# Patient Record
Sex: Female | Born: 1955 | Race: White | Hispanic: No | Marital: Married | Smoking: Former smoker
Health system: Southern US, Community
[De-identification: ages and names within clinical notes are randomized; demographics above are authoritative.]

## PROBLEM LIST (undated history)

## (undated) DIAGNOSIS — I10 Essential (primary) hypertension: Secondary | ICD-10-CM

## (undated) DIAGNOSIS — J449 Chronic obstructive pulmonary disease, unspecified: Secondary | ICD-10-CM

## (undated) DIAGNOSIS — I82409 Acute embolism and thrombosis of unspecified deep veins of unspecified lower extremity: Secondary | ICD-10-CM

## (undated) DIAGNOSIS — N200 Calculus of kidney: Secondary | ICD-10-CM

## (undated) DIAGNOSIS — R609 Edema, unspecified: Secondary | ICD-10-CM

## (undated) DIAGNOSIS — I2699 Other pulmonary embolism without acute cor pulmonale: Secondary | ICD-10-CM

## (undated) DIAGNOSIS — M109 Gout, unspecified: Secondary | ICD-10-CM

## (undated) DIAGNOSIS — E669 Obesity, unspecified: Secondary | ICD-10-CM

---

## 1999-03-10 ENCOUNTER — Encounter: Admission: RE | Admit: 1999-03-10 | Discharge: 1999-03-10 | Payer: Self-pay | Admitting: Urology

## 1999-03-10 ENCOUNTER — Encounter: Payer: Self-pay | Admitting: Urology

## 2006-04-08 ENCOUNTER — Emergency Department (HOSPITAL_COMMUNITY): Admission: EM | Admit: 2006-04-08 | Discharge: 2006-04-08 | Payer: Self-pay | Admitting: Emergency Medicine

## 2007-08-01 ENCOUNTER — Observation Stay (HOSPITAL_COMMUNITY): Admission: EM | Admit: 2007-08-01 | Discharge: 2007-08-02 | Payer: Self-pay | Admitting: Emergency Medicine

## 2007-08-04 ENCOUNTER — Inpatient Hospital Stay (HOSPITAL_COMMUNITY): Admission: EM | Admit: 2007-08-04 | Discharge: 2007-08-10 | Payer: Self-pay | Admitting: Emergency Medicine

## 2007-08-04 ENCOUNTER — Ambulatory Visit: Payer: Self-pay | Admitting: Emergency Medicine

## 2007-08-04 ENCOUNTER — Encounter (INDEPENDENT_AMBULATORY_CARE_PROVIDER_SITE_OTHER): Payer: Self-pay | Admitting: Internal Medicine

## 2007-08-06 ENCOUNTER — Ambulatory Visit: Payer: Self-pay | Admitting: Vascular Surgery

## 2007-08-06 ENCOUNTER — Encounter (INDEPENDENT_AMBULATORY_CARE_PROVIDER_SITE_OTHER): Payer: Self-pay | Admitting: Internal Medicine

## 2007-08-06 ENCOUNTER — Encounter (INDEPENDENT_AMBULATORY_CARE_PROVIDER_SITE_OTHER): Payer: Self-pay | Admitting: Emergency Medicine

## 2007-08-21 ENCOUNTER — Encounter: Payer: Self-pay | Admitting: Emergency Medicine

## 2008-09-30 ENCOUNTER — Emergency Department (HOSPITAL_COMMUNITY): Admission: EM | Admit: 2008-09-30 | Discharge: 2008-09-30 | Payer: Self-pay | Admitting: Emergency Medicine

## 2009-05-23 ENCOUNTER — Emergency Department (HOSPITAL_COMMUNITY): Admission: EM | Admit: 2009-05-23 | Discharge: 2009-05-23 | Payer: Self-pay | Admitting: Emergency Medicine

## 2010-05-09 ENCOUNTER — Encounter: Payer: Self-pay | Admitting: Internal Medicine

## 2010-05-24 ENCOUNTER — Ambulatory Visit (HOSPITAL_COMMUNITY)
Admission: RE | Admit: 2010-05-24 | Discharge: 2010-05-24 | Disposition: A | Discharge status: Home / Self Care | Payer: Self-pay | Source: Ambulatory Visit | Attending: Internal Medicine | Admitting: Internal Medicine

## 2010-05-24 ENCOUNTER — Other Ambulatory Visit (HOSPITAL_COMMUNITY): Payer: Self-pay | Admitting: Internal Medicine

## 2010-05-24 DIAGNOSIS — M25569 Pain in unspecified knee: Secondary | ICD-10-CM | POA: Insufficient documentation

## 2010-05-24 DIAGNOSIS — M25562 Pain in left knee: Secondary | ICD-10-CM

## 2010-05-24 DIAGNOSIS — M25552 Pain in left hip: Secondary | ICD-10-CM

## 2010-07-07 LAB — RAPID STREP SCREEN (MED CTR MEBANE ONLY): Streptococcus, Group A Screen (Direct): NEGATIVE

## 2010-07-26 LAB — POCT CARDIAC MARKERS
CKMB, poc: <1 ng/mL — ABNORMAL LOW (ref 1.0–8.0)
Myoglobin, poc: 67.1 ng/mL (ref 12–200)

## 2010-07-26 LAB — CBC
Hemoglobin: 14.2 g/dL (ref 12.0–15.0)
MCHC: 34 g/dL (ref 30.0–36.0)
RDW: 15.4 % (ref 11.5–15.5)

## 2010-07-26 LAB — BASIC METABOLIC PANEL
CO2: 29 mEq/L (ref 19–32)
Calcium: 9.9 mg/dL (ref 8.4–10.5)
Creatinine, Ser: 0.97 mg/dL (ref 0.4–1.2)
GFR calc Af Amer: >60 mL/min (ref >60)
GFR calc non Af Amer: 60 mL/min — ABNORMAL LOW (ref >60)
Glucose, Bld: 116 mg/dL — ABNORMAL HIGH (ref 70–99)
Sodium: 139 mEq/L (ref 135–145)

## 2010-07-26 LAB — D-DIMER, QUANTITATIVE: D-Dimer, Quant: 0.22 ug/mL-FEU (ref 0.00–0.48)

## 2010-07-26 LAB — DIFFERENTIAL
Basophils Absolute: 0 10*3/uL (ref 0.0–0.1)
Basophils Relative: 0 % (ref 0–1)
Lymphocytes Relative: 20 % (ref 12–46)
Monocytes Absolute: 0.2 10*3/uL (ref 0.1–1.0)
Neutro Abs: 5.2 10*3/uL (ref 1.7–7.7)
Neutrophils Relative %: 76 % (ref 43–77)

## 2010-08-31 NOTE — Discharge Summary (Signed)
Nancy Wheeler, ROZAR           ACCOUNT NO.:  [id number]   MEDICAL RECORD NO.:  [id number]          PATIENT TYPE:  INP   LOCATION:  2920                         FACILITY:  [facility]   PHYSICIAN:  Hind I Elsaid, MD      DATE OF BIRTH:  Sep 02, 1955   DATE OF ADMISSION:  08/04/2007  DATE OF DISCHARGE:                               DISCHARGE SUMMARY   PRIMARY CARE PHYSICIAN:  Nancy Rear. Sherwood Gambler, MD, [place], [place].   DISCHARGE DIAGNOSES:  1. Multiple bilateral pulmonary emboli.  2. Hypoxemic respiratory failure.  3. Altered mental status secondary to hypoxemic respiratory failure.  4. Morbid obesity/obesity hypoventilation syndrome, to rule out      obstructive sleep apnea.  5. High liver function tests, improving.  6. Microcytic anemia.  7. Acute renal failure, resolved.  8. History of staghorn calculi of the right kidney secondary to uric      acid stone.   Medications to be dictated at the date of discharge.   CONSULTATION:  Cardiology consulted, done by [facility].  Pulmonary consulted, done by Dr. Delton Coombes.   PROCEDURES:  1. Chest x-ray.  Cardiomegaly with vascular congestion, worsening      bibasilar atelectasis developing right lower lobe infiltrate,      __________.  2. CT angiogram.  Multiple bilateral pulmonary emboli with bibasilar      atelectasis.  3. 2D echo.  Left ventricular size were the upper limit of normal.      Overall, left ventricular systolic function was normal.  Left      ventricular ejection fraction was estimated to be 55% to 60%.  This      is for the inadequate to evaluate the left ventricular region wall      motion abnormalities.  Features were consistent with pseudo normal      left ventricular filling, better, has concomitant abnormal      relaxation and increased filling pressure.  The left atrium was      mildly dilated.   HISTORY OF PRESENT ILLNESS:  This is a 55 year old female, who was  recently discharged from [facility]  on above CT scan for renal colic  secondary to urolithiasis.  The patient woke up at 3 a.m. went to  recline and then sat down, her spouse noticed that there was some  respiratory difficulties and altered mental status.  EMS was called.  The patient's condition improved with oxygen.  The patient presented  with hypoxemic hypercapnic respiratory failure and found to have  elevated D-dimer and cardiac enzymes.  Also, the patient was found to  have acute renal failure compared to kidney function drawn on August 02, 2007.  Accordingly, the patient was started on IV fluid and Lovenox full  dose.  Difficulty was with the CT angiogram secondary to acute renal  failure, which was then after resolution of, had acute renal failure.  CT angiogram confirmed bilateral pulmonary emboli.  The patient  according to her lab, started on heparin and Coumadin.  The patient has  a lower extremity venous Doppler, which was negative for any deep vein  thrombosis.  The patient is still continued to require high amount of  oxygen mainly.  At this time, the patient was 60% nonrebreather mask  with BiPAP at night.  Pulmonary were asked to evaluate the patient.Most  probably the hypoxia secondary to obstructive sleep apnea/morbid obesity  and cannot exclude obesity hypoventilation syndrome.  Plan is to titer FiO2 for her to keep oxygen saturation more than 92%  and to continue with the BiPAP at that time.  The patient need to have  outpatient sleep studies started.  1. Altered mental status, completely resolved during hospitalization.  2. Elevated troponin, which is most probably secondary to pulmonary      embolism, cardiology followup with [place], the patient and there seem      to be elevated cardiac enzymes, secondary toPE.  3. Elevated LFTs, which is improving at this time, could be secondary      to result of hypOtension or ischemia secondary to the hypoxia.  At      this time, liver function test is improving.   Hepatitis profile was      negative.  4. Anemia, which looks microcytic normochromic anemia.  Her iron study      show evidence of iron deficiency anemia.  Hemoglobin is stable at      9.8.  Stool guaiac was negative for any evidence of GI bleeding.      We recommended to evaluate for EGD and colonoscopy for evaluation      of any evidence of GI bleeding after the respiratory status      improved.      Hind Bosie Helper, MD  Electronically Signed     HIE/MEDQ  D:  08/07/2007  T:  08/08/2007  Job:  [id number]

## 2010-08-31 NOTE — H&P (Signed)
NAME:  Nancy Wheeler, Nancy Wheeler           ACCOUNT NO.:  [id number]   MEDICAL RECORD NO.:  [id number]          PATIENT TYPE:  OBV   LOCATION:  A323                          FACILITY:  [facility]   PHYSICIAN:  Dorris Singh, DO    DATE OF BIRTH:  1956-03-22   DATE OF ADMISSION:  08/01/2007  DATE OF DISCHARGE:  [facility]                              HISTORY & PHYSICAL   CHIEF COMPLAINT:  Flank pain, nausea, vomiting.   PRIMARY CARE PHYSICIAN:  Dr. Sherwood Gambler.   HISTORY OF PRESENT ILLNESS:  The patient is a 55 year old woman who  presented to the [facility] Emergency Room complaining of flank pain.  She stated that it started today and the onset was sudden.  She also  stated that there was more discomfort on the right side than the left.  She also stated she was unable to urinate and she has a chronic history  of uric kidney stones for which she takes allopurinol and she sees a  urologist, and she was concerned that this point in time with this pain  and that she has had nephrolithiasis before, she was concerned that she  could have a blockage from one of her kidney stones passing through her  urologic system.  She came to the emergency room to be evaluated.  Her  last normal period was March 08, 2006.  She has a past medical  history significant for hypertension, kidney stones, obesity and uric  acid kidney stones.   SOCIAL HISTORY:  She currently is a nonsmoker, nondrinker and denies any  drug abuse.   ALLERGIES:  SHE DOES HAVE ALLERGIES TO PENICILLIN AND CODEINE.   CURRENT MEDICATIONS:  Include Norvasc 5 mg once a day, allopurinol 300  mg once a day, benazepril.  There is no dose given.  Zyrtec 10 mg once a  day, aspirin 81 mg and Mucinex.  No dose given.  The patient also states  she takes Demerol for pain at home.   REVIEW OF SYSTEMS:  CONSTITUTIONAL:  Negative for weight loss, appetite  change.  EYES:  Negative for blurred vision.  EARS, NOSE, MOUTH AND  THROAT:  All negative.  CARDIOVASCULAR:   Negative for chest pain or  dyspnea.  RESPIRATORY:  Negative for wheezing, cough or shortness of  breath.  GASTROINTESTINAL:  Positive for abdominal pain.  Negative for  diarrhea, constipation.  Positive for nausea and vomiting.  GU:  Positive for flank pain.  MUSCULOSKELETAL:  Positive for back pain.  SKIN:  Negative for any rash or pruritus.  NEURO:  Negative for any  weakness or syncope.  METABOLIC:  Negative for intolerances to cold or  hot.  HEMATOLOGIC:  Negative for any anemia.   PHYSICAL EXAM:  Her blood pressure is 156/88, pulse rate 99, respiration  rate 20, temperature 97.1.  She is satting 99% on room air.  GENERAL:  The patient is a morbidly obese, well-developed, well-  nourished woman who appears her stated age.  Head is normocephalic,  atraumatic.  Eyes are EOMI, PERRLA and conjunctive show no injection or  discharge.  Sclerae, there is no icterus.  Ears:  The tympanic membranes  were visualized bilaterally.  ____________ are moist.  Teeth are in good  repair.  There is no erythema or exudate.  NECK:  Supple, thick, but nontender, no lymphadenopathy.  HEART:  Regular rate and rhythm.  S1 and S2 present.  Heart sounds were  distant.  RESPIRATORY:  Clear to auscultation bilaterally.  Breath sounds are  distant.  No rales, wheezes or rhonchi.  CHEST:  Movements are symmetrical with respirations.  ABDOMEN:  Large, pendulous, soft, nontender, nondistended.  Bowel sounds  in all 4 quadrants.  There is some mild CVA discomfort.  EXTREMITIES:  Positive pulses.  Lower Extremities:  No edema or cyanosis  noted.  SKIN:  Good turgor, good texture.  NEURO:  Cranial nerves II-XII are grossly intact.  Good sensation and  good strength throughout all extremities.   LABS:  Include a white count of 11.9, hemoglobin of 10.8, hematocrit of  32.2, platelet count of 293.  Sodium 137, potassium 4.4, chloride 104,  CO2 27, glucose 141, BUN 16, creatinine 0.93 and her urine currently is   negative.  While the patient was in the emergency room she apparently  passed the stone per patient and at that point in time she was admitted  to the service of [facility].   ASSESSMENT/PLAN:  1. Intractable nausea and vomiting.  2. Abdominal pain.  3. History of nephrolithiasis.   PLAN:  Admit the patient to the service of [facility] and observation.  Will hydrate her with IV fluids.  Will also provide pain medication  based on the patient's allergies.  Will start her off with Dilaudid.  Will give anti-emetics that include Zofran and Phenergan.  Will place  her on her home medications.  Will ask the nurse to get doses of blood  pressure medications as well.  Will give her a fluid bolus.  Will also  strain her urine for any type of stone and will reassess her in the  morning.  The plan is that if she has improved, plan on discharging  within 24 hours.      Dorris Singh, DO  Electronically Signed     CB/MEDQ  D:  08/01/2007  T:  08/02/2007  Job:  [id number]   cc:   Madelin Rear. Sherwood Gambler, MD  Fax: [phone]

## 2010-08-31 NOTE — Consult Note (Signed)
NAME:  Nancy Wheeler, Nancy Wheeler NO.:  [id number]   MEDICAL RECORD NO.:  [id number]           PATIENT TYPE:   LOCATION:                                 FACILITY:   PHYSICIAN:  Jake Bathe, MD      DATE OF BIRTH:  07-11-1955   DATE OF CONSULTATION:  08/04/2007  DATE OF DISCHARGE:                                 CONSULTATION   REASON FOR CONSULTATION:  Nancy Wheeler is being seen at the request of  Dr. Eda Paschal, [facility] hospitalist for the evaluation of elevated  troponin, possible acute coronary syndrome.   HISTORY OF PRESENT ILLNESS:  Nancy Wheeler is a 55 year old female with,  morbid obesity, weighing approximately 400 pounds with a uric acid  stones and recent evaluation in [facility] Emergency Department for  kidney stone pain.  At that visit, she was given Dilaudid and Phenergan,  was sent home, and yesterday was taking added Phenergan and then was  found basically unresponsive by her husband.  She slept almost 17 hours  in duration.  EMS was called, sent to [facility] for further evaluation.  Once here she was found to be hypoxic requiring face mask O2 never  intubated.  Cardiac biomarkers were drawn with second set showing CK  783, MB 4.7, troponin 0.53 mildly elevated.  I was asked to comment on  elevated troponin.   Prior to this hospitalization other than her kidney stone pain she  complains of shortness of breath with walking.  She does attribute this  to her obesity.  She has no prior cardiac history.  No chest pain  currently and no chest pain prior.  No syncope.  No presyncope.   PAST MEDICAL HISTORY:  1. Morbid obesity.  2. Uric acid stones on allopurinol.  3. Hypertension on 5 mg of Norvasc and 40 mg of benazepril.   PAST SURGICAL HISTORY:  None.   ALLERGIES:  Intolerance now to DILAUDID, CODEINE and PENICILLIN.   SOCIAL HISTORY:  She quit tobacco 20 years ago.  No alcohol use.  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  Her son and daughter were here as well as her mother-in-  Social worker.  She is married.   FAMILY HISTORY:  Her mother had myocardial infarction at age 38.   REVIEW OF SYSTEMS:  No bleeding, no syncope, weakness noted.  Unless  specified above all other 12 review of systems negative.   PHYSICAL EXAMINATION:  VITAL SIGNS:  Blood pressure 93-107/44-48, pulse  72-90, respirations 22, satting 97% on 60% face mask. Upon arrival she  decided to 81% on room air.  GENERAL:  Alert and oriented x3 in no acute  distress, lying in bed here with her family.  EYES:  Well-perfused conjunctivae.  EOMI.  Scleral icterus.  NECK:  Thick.  No carotid bruits appreciated.  No JVD.  CARDIOVASCULAR:  Distant heart sounds.  No appreciable murmurs, rubs or  gallops.  LUNGS:  Clear to auscultation bilaterally.  Normal respiratory effort.  ABDOMEN:  Soft, nontender, normoactive bowel sounds.  Obese.  No bruits  heard.  EXTREMITIES:  1 to 2+ edema, chronic venous insufficiency changes,  bilateral lower extremities palpable distal pulses.  NEUROLOGIC:  Nonfocal.  No tremors, oriented during this exam.  SKIN:  Warm, dry and intact. No rashes noted.   DIAGNOSTIC DATA:  EKG shows normal sinus rhythm, low voltage,  nonspecific ST-T wave changes.  QTC was 482. Chest x-ray personally  reviewed shows cardiomegaly and right basilar atelectasis.   LABORATORY DATA:  Sodium 137, potassium 3.9, BUN 36, creatinine 2.1, AST  1037, ALT 861, blood gas 7.38/46/126, white count 12.9, hemoglobin 9.5,  hematocrit 29.4, platelets 242, total cholesterol 155, triglycerides  180, LDL 86, HDL 33, CK most recent 783, MB 4.7, troponin 0.53. Point of  care cardiac markers in the ED showed myoglobin greater than 500, CK-MB  3.2, troponin 0.35, and D-dimer elevated at 5.11.   ASSESSMENT AND PLAN:  A 55 year old female with morbid obesity, status  post hypoxic respiratory failure, possibly secondary to medications   sedation with chronic renal insufficiency and elevated troponin.  1. Elevated troponin - not likely acute coronary syndrome.  Note that      she has chronic renal insufficiency and that she may have had a      small strain secondary to hypoxemia.  CK is elevated but MB is      normal and ratio is reassuring.  CK may be elevated just due to      sedentary nature and muscle breakdown.  We would go ahead and      continue aggressive medical therapy with aspirin and      antihypertensives.  We will check echocardiogram.  2. Hypoxemia - concerning for possible pulmonary embolism which is      currently being ruled out with V/Q scan.  In the meantime, she is      being placed on heparin drip.  She does have risk factors of      obesity and sedentary state.  3. Obesity - counseled on weight loss.  4. Uric acid stones - allopurinol.  5. Hypertension - continue Norvasc and benazepril.  Will follow along      with you.      Jake Bathe, MD  Electronically Signed     MCS/MEDQ  D:  08/04/2007  T:  08/04/2007  Job:  [id number]

## 2010-08-31 NOTE — Discharge Summary (Signed)
NAME:  Nancy Wheeler, Nancy Wheeler           ACCOUNT NO.:  [id number]   MEDICAL RECORD NO.:  [id number]          PATIENT TYPE:  OBV   LOCATION:  A323                          FACILITY:  [facility]   PHYSICIAN:  Skeet Latch, DO    DATE OF BIRTH:  [birth year]   DATE OF ADMISSION:  08/01/2007  DATE OF DISCHARGE:  04/16/2009LH                               DISCHARGE SUMMARY   DISCHARGE DIAGNOSES:  1. Intractable nausea and vomiting, now resolved.  2. Abdominal discomfort.  3. History of nephrolithiasis.   BRIEF HOSPITAL COURSE:  Please see H&P done by Dr. Elige Radon on  08/01/2007.  This is a 55 year old Caucasian female who presented to  [facility] ER complaining of flank pain.  The patient complained it  started suddenly and was severe in nature.  The patient stated that  there was more discomfort on her right side than her left.  She had  problems with urination.  The patient has a chronic history of kidney  stones, for which she takes allopurinol and sees urology, and was  concerned that this was a kidney stone.  The patient is also concerned  that she had blockage from 1 of her stones upon being seen.  She came to  the ER.  She had a CT of her abdomen and pelvis performed, which showed:  1. Multiple right renal calculi, including staghorn calculus at the      right renal pelvis measuring 2.5 cm in size.  2. Right perinephritic edema.  Pelvis showed question 6 mm diameter      passed calculus in the urinary bladder versus a mural calcification      in the left bladder wall posteriorly.  3. A 2.4 cm diameter fat and calcium-containing mass left adnexa,      suspicious for left ovarian dermoid tumor.   The patient was seen.  She was admitted to the service of [facility].  She was placed on IV hydration, as well as IV pain medication.  The  patient was also placed on antiemetics, and placed on her home  medications.  The patient has improved overnight and, at this time, she  is stable to be  discharged to home.   VITAL SIGNS ON DISCHARGE:  Temperature 99.5, pulse 99, respirations 22,  blood pressure 153/83, she is satting 97% on room air.   LABS:  From 08/01/2007 showed a sodium 137, potassium 4.4, chloride 104,  CO2 27, glucose 141, BUN 16, creatinine 0.93, white count 11.9,  hemoglobin 10.8, hematocrit 32.2, platelet count 293.   MEDICATIONS ON DISCHARGE:  Include allopurinol 300 mg 1 tab daily, she  is to resume her benazepril at previous dose, Norvasc 5 mg daily,  aspirin 81 mg daily, Zyrtec 10 mg daily, Mucinex DM daily.  The patient  states she was on Demerol.  She will resume that at previous dose also.   CONDITION ON DISCHARGE:  Stable.   DISPOSITION:  The patient to be discharged to home.   DISCHARGE INSTRUCTIONS:  The patient is to maintain a low sodium heart-  healthy diet.  She is to increase her activities slowly.  She  is to  follow up with Dr. Sherwood Gambler in the next 5 to 7 days.  The patient is to  follow up with her urologist at her next scheduled appointment if she  has complaints of flank pain or concern about her kidney stones.      Skeet Latch, DO  Electronically Signed     SM/MEDQ  D:  08/02/2007  T:  08/02/2007  Job:  [id number]   cc:   Madelin Rear. Sherwood Gambler, MD  Fax: [phone]

## 2010-08-31 NOTE — H&P (Signed)
NAME:  Nancy Wheeler, Nancy Wheeler NO.:  [id number]   MEDICAL RECORD NO.:  [id number]          PATIENT TYPE:  INP   LOCATION:  1823                         FACILITY:  [facility]   PHYSICIAN:  Isidor Holts, M.D.  DATE OF BIRTH:  [birth year]   DATE OF ADMISSION:  08/04/2007  DATE OF DISCHARGE:                              HISTORY & PHYSICAL   PRIMARY MEDICAL DOCTOR:  Dr. Audie Pinto Carmichaels.   The patient is unassigned to [place].   CHIEF COMPLAINT:  Respiratory difficulties, somnolence, fall x2.   HISTORY OF PRESENT ILLNESS:  This is a 55 year old female.  History is  in the main, supplied by her husband, who was present in the emergency  department.  According to him, she was admitted to [facility]  on August 01, 2007 to August 02, 2007 for renal colic secondary to  urolithiasis.  Following discharge, she had dry heaves, ate a big meal,  and on arrival at home about 5:00 p.m., took 2 tablets of Phenergan  followed by 2 tablets of 10-mg Valium.  She subsequently went to bed at  about 12:00 a.m. August 03, 2007.  She woke up at 3:00 a.m., went to a  recliner and sat down.  Her spouse at that time felt that she had some  respiratory difficulties; however, after watching her for a while he  checked on her again at about 5:00 a.m. and at that time felt that her  lips were a little blue.  At about 8:00 a.m., she had become  incoherent, and she slept most of the day.  She became increasingly  lethargic and as a matter of fact fell twice on attempts to get up.  Her  husband called the EMS, who arrived and administered oxygen, and she  improved with this.  She denies chest pain, however.   PAST MEDICAL HISTORY:  1. Hypertension.  2. Urolithiasis with  stones.  3. Morbid obesity.   MEDICATIONS:  1. Allopurinol 300 mg p.o. daily.  2. Benazepril, dose unknown.  3. Norvasc 5 mg p.o. daily.  4. Aspirin 81 mg p.o. daily.  5. Zyrtec 10 mg p.o. daily.  6. Mucinex DM daily.  7.  Demerol p.r.n., dose unknown.   ALLERGIES:  CODEINE AND PENICILLIN.   REVIEW OF SYSTEMS:  As her HPI and chief complaint.  Denies abdominal  pain, vomiting or diarrhea.  Denies fever or chills.  Denies cough.   SOCIAL HISTORY:  The patient is married, nonsmoker, nondrinker, has no  history of drug abuse.   FAMILY HISTORY:  Noncontributory.   PHYSICAL EXAMINATION:  VITAL SIGNS:  Temperature 97.1, pulse 78 per  minute, regular.  Respiratory rate 24, BP 95/54 mmHg, pulse oximeter 81%  to 84% on room air.  Following administration of 10 liters of oxygen via  mask, 94%.  GENERAL:  The patient did not appear to be in obvious acute distress at  time of this evaluation, alert, communicative, talking complete  sentences.  Not short of breath at rest.  HEENT:  No clinical pallor, no jaundice, no conjunctival injection.  Throat is clear.  NECK:  Supple.  JVP not seen secondary to fat neck.  CHEST:  Clinically clear to auscultation.  No wheezes, no crackles.  HEART:  Heart sounds 1 and 2 heard, normal, regular, no murmurs.  ABDOMEN:  Morbidly obese, unable to palpate organs.  LOWER EXTREMITY:  No pitting edema, palpable peripheral pulses.  MUSCULOSKELETAL:  Nonfocal exam.  CENTRAL NERVOUS SYSTEM:  No focal neurologic deficits on course of  examination.   INVESTIGATIONS:  Hemoglobin 11.9, hematocrit 35, troponin-I at point of  care 0.25, D-dimer 5.11.  Electrolytes:  Sodium 135, potassium 4.3,  chloride 101, CO2 26, BUN 3, creatinine 2.4, glucose 121.  ABG:  pH  7.337, PCO2 50.8, PO2 108, bicarbonate 27.4.  Chest x-ray data:  August 04, 2007 shows cardiomegaly, no heart failure.  Right distal  atelectasis.  A 12-lead EKG dated August 04, 2007 shows sinus rhythm,  regular 89 per minute, normal axis pattern, right bundle branch block  pattern.   ASSESSMENT/PLAN:  1. Respiratory failure type 2 i.e. hypercapnic/hypoxic, unclear      etiology.  I suspect obesity hypoventilation syndrome with       medication side effect; however, in view of recent hospitalization      it is important to rule out pulmonary embolism.  Unfortunately, the      creatinine is elevated; therefore, we can not do chest CT      angiogram.  We will have to do ventilation perfusion lung scan,      which has already been ordered by ED MD and is pending.  Meanwhile,      we will start empiric therapeutic Lovenox and utilize p.r.n. BiPAP.   1. Acute renal failure.  Creatinine was 0.93 on August 02, 2007 and BUN      was 16.  We shall manage with intravenous fluids and follow renal      indices.   1. History of urolithiasis.  The patient is currently asymptomatic.   Further management will depend on clinical course.      Isidor Holts, M.D.  Electronically Signed     CO/MEDQ  D:  08/04/2007  T:  08/04/2007  Job:  [id number]   cc:   Madelin Rear. Sherwood Gambler, MD

## 2010-08-31 NOTE — Consult Note (Signed)
Nancy Wheeler, Nancy Wheeler NO.:  [id number]   MEDICAL RECORD NO.:  [id number]          PATIENT TYPE:  INP   LOCATION:  2305                         FACILITY:  [facility]   PHYSICIAN:  Leslye Peer, MD    DATE OF BIRTH:  1955-05-03   DATE OF CONSULTATION:  08/06/2007  DATE OF DISCHARGE:                                 CONSULTATION   REASON FOR CONSULTATION:  Hypoxia,   CONSULTING PHYSICIAN:  Dr. Cristal Deer OT.   HISTORY OF PRESENT ILLNESS:  This is a 55 year old, morbidly obese,  white female who was initially admitted to [facility] on August 04, 2007 following a recent hospital discharge on August 02, 2007 where  she was treated for renal colic secondary to lithiasis. Apparently she  had been given a prescription for narcotics and benzodiazepines for  symptomatic relief of back pain, and renal colic. She maintained that  she did not have significant dyspnea.  She had reported to have been  taking both Phenergan and Valium at home following hospital discharge,  had had significant nausea, following administration of Dilaudid at  [facility].  Later during the evening of August 03, 2007, to the  morning of August 04, 2007 she was noted by her husband to have labored  respiratory pattern and a blue discoloration of her lips and therefore  the emergency response was called.  She had become progressively  lethargic with altered mental status, and impaired gait resulting in  fall x2.  Per medical records, her mental status improved with  supplemental oxygen.  Her diagnostic evaluation was positive for  bilateral pulmonary emboli, as well as bilateral atelectasis.  Therapy  to date has included empiric antibiotics with Avelox, IV systemic Solu-  Medrol, and IV anticoagulation with heparin, as well as systemic  anticoagulation with Coumadin.  She has required as high as 60%  supplemental oxygen during hospitalization with recumbent oxygen  saturations as low  as 85%, and exertional oxygen saturations in the mid  80s in spite of 6 liters nasal cannula.  The pulmonary service was asked  to evaluate in regards to her hypoxemia, and furthermore regarding  questions of underlying obesity hypoventilation syndrome, obstructive  sleep apnea, and therapy of pulmonary emboli.   PAST MEDICAL HISTORY:  Morbid obesity, current weight recorded at 183.4  kg.  Urolithiasis with stones, hypertension, environmental allergies,  and gout.   SOCIAL HISTORY:  She is married.  She stopped smoking approximately 20  years ago.  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  At baseline,  she reports exertional dyspnea with minimal activity.  On a typical day,  she reports becoming short of breath and requiring rest after dressing  in the morning.   FAMILY HISTORY:  There is no blood history of thromboembolic disease in  the form of DVT, or pulmonary thromboembolism.  Her mother did have  cardiac disease, there is no history of cancer in the family.  Of note,  her husband had been treated for pulmonary emboli.  ALLERGIES:  PENICILLIN, CODEINE, also sensitivity to DILAUDID.   CURRENT MEDICATIONS:  1. Aspirin.  2. Mucinex.  3. Sliding scale NovoLog insulin.  4. Atrovent q.6 h.  5. Solu-Medrol 60 mg IV q. 6 hours.  6. Avelox 400 mg IV q.24 h.  7. Protonix 40 mg p.o. daily.  8. Coumadin.  9. Heparin drip.  10.Xopenex p.r.n.   HOME MEDICATIONS:  Allopurinol, benazepril, Norvasc, aspirin, Zyrtec,  Mucinex, Demerol p.r.n.   REVIEW OF SYMPTOMS:  She is back to baseline.  The patient feels  strongly that this is secondary to medications.  She denies chest pain,  she does report environmental farm until postnasal drip with allergies.  She denies heartburn, chest discomfort, chest pain, gastroesophageal  reflux symptoms, however, does report a cough which is primarily noted  during recumbent  position.  She denies hemoptysis, denies discoloration  of sputum.  Has had no fever, does have bilateral chronic lower  extremity edema, denies pain, has no erythema.  Currently feels her  breathing is at baseline.   PHYSICAL EXAM:  Temperature 98.6, heart rate 72, blood pressure 121/55,  respirations 22-24, saturations 95% on 60% non-rebreather, have seen  saturations in the upright position 92% on 6 liters nasal cannula,  saturations have dropped to 85% in recumbent position.  GENERAL:  No acute distress, saturations as noted.  HEENT:  The neck is large, the mucous membranes are moist, there is no  clear jugular venous distention, no adenopathy.  There is a faint upper  airway expiratory wheeze.  PULMONARY:  Decreased in the bases.  CARDIAC:  Regular rate and rhythm.  ABDOMEN:  Obese, with no organomegaly and positive bowel sounds.  GU:  There is a Foley catheter in place.  EXTREMITIES:  Demonstrate bilateral edema without erythema or pain.  NEUROLOGIC:  Intact.   LABORATORY DATA:  Sodium 140, potassium 4.4, chloride 104, CO2 27. BUN  20, creatinine 0.95, glucose 225, hemoglobin 9.8, hematocrit 29.7, white  blood cell count 10.1, platelet count 230. Urinary Legionella negative,  spot strep test negative.  Monospot negative.  TSH 1.38.  BNP 230, D-  dimer 5.11.   IMPRESSION AND PLAN:  Hypoxic respiratory failure secondary to morbid  obesity, probable obesity hypoventilation syndrome and probable  obstructive sleep apnea, exacerbated by bilateral pulmonary emboli with  bilateral atelectasis.  There is currently no evidence of upper airway  infection, no evidence of pneumonia, no evidence of chronic obstructive  pulmonary disease or asthmatic exacerbation.  She is close to baseline  from a symptom standpoint.  Would wonder to what extent pulmonary emboli  may be an acute on chronic phenomenon for this patient.  Also would be  concerned about secondary pulmonary artery hypertension.   However, many  things to treat at this point before further investigating this i.e.  treating probable obstructive sleep apnea, aggressive weight loss  program, supplemental oxygen, suspect she has required supplemental  oxygen for some time prior to this initial evaluation.  Therefore  recommendations are to titrate FIO2 for sats greater than 92%, we will  trial an auto set BiPAP device at hour of sleep to evaluate nighttime  saturations, as well as daytime somnolence, and tolerance to this  procedure, she will require an outpatient formal polysomnogram to rule  out obstructive sleep apnea.  Additionally agree with anticoagulation.  Now that she is on a heparin drip will have to wait on procoagulant  panel to help determine length of therapy but minimally will require 6  months of anticoagulation.  We will discontinue Solu-Medrol, as no  evidence of asthmatic exacerbation, or bronchospasm, will also  discontinue Avelox as there is no evidence of upper airway infection,  bronchitis, or pneumonia, and this will further complicate her  anticoagulation regimen during Coumadin and menstruation.  Additionally  we will ask for a nutritional consultation for weight loss and dietary  counseling, follow-up echocardiogram and lower extremity Dopplers.  She  will certainly need outpatient  pulmonary follow-up for titration of therapy in regards to obesity  hypoventilation syndrome, obstructive sleep apnea, and further  evaluation for secondary pulmonary artery hypertension.  Thank you very  much for the opportunity to participate in Ms. Person' care.      Zenia Resides, NP      Leslye Peer, MD  Electronically Signed    PB/MEDQ  D:  08/06/2007  T:  08/06/2007  Job:  [id number]   cc:   Isidor Holts, M.D.

## 2011-01-11 LAB — LIPID PANEL
LDL Cholesterol: 86
Total CHOL/HDL Ratio: 4.7
Triglycerides: 180 — ABNORMAL HIGH
VLDL: 36

## 2011-01-11 LAB — LUPUS ANTICOAGULANT PANEL
DRVVT: 45.3 (ref 36.1–47.0)
Lupus Anticoagulant: NOT DETECTED
PTTLA 4:1 Mix: 56.2 — ABNORMAL HIGH (ref 36.3–48.8)
PTTLA Confirmation: 1.6 (ref <8.0)

## 2011-01-11 LAB — CBC
HCT: 28.2 — ABNORMAL LOW
HCT: 28.8 — ABNORMAL LOW
HCT: 29.4 — ABNORMAL LOW
HCT: 30.8 — ABNORMAL LOW
Hemoglobin: 9.2 — ABNORMAL LOW
Hemoglobin: 9.5 — ABNORMAL LOW
Hemoglobin: 9.5 — ABNORMAL LOW
Hemoglobin: 9.9 — ABNORMAL LOW
MCHC: 33.7
MCHC: 32.1
MCHC: 32.2
MCHC: 32.4
MCHC: 32.5
MCHC: 33.1
MCHC: 33.2
MCV: 75.5 — ABNORMAL LOW
MCV: 77.1 — ABNORMAL LOW
MCV: 77.4 — ABNORMAL LOW
Platelets: 293
Platelets: 209
Platelets: 212
Platelets: 230
Platelets: 236
RBC: 4.26
RBC: 3.69 — ABNORMAL LOW
RBC: 3.73 — ABNORMAL LOW
RBC: 3.73 — ABNORMAL LOW
RBC: 3.81 — ABNORMAL LOW
RBC: 3.81 — ABNORMAL LOW
RBC: 3.99
RDW: 19 — ABNORMAL HIGH
WBC: 10
WBC: 8.9
WBC: 9.4

## 2011-01-11 LAB — COMPREHENSIVE METABOLIC PANEL
ALT: 172 — ABNORMAL HIGH
ALT: 329 — ABNORMAL HIGH
ALT: 475 — ABNORMAL HIGH
AST: 1037 — ABNORMAL HIGH
AST: 231 — ABNORMAL HIGH
AST: 34
AST: 88 — ABNORMAL HIGH
Albumin: 2.8 — ABNORMAL LOW
Albumin: 2.9 — ABNORMAL LOW
Albumin: 3 — ABNORMAL LOW
Albumin: 3.1 — ABNORMAL LOW
Alkaline Phosphatase: 115
Alkaline Phosphatase: 118 — ABNORMAL HIGH
BUN: 20
BUN: 31 — ABNORMAL HIGH
CO2: 25
CO2: 28
CO2: 29
Calcium: 8.1 — ABNORMAL LOW
Calcium: 8.3 — ABNORMAL LOW
Calcium: 8.6
Calcium: 8.6
Chloride: 101
Chloride: 105
Chloride: 105
Chloride: 106
Creatinine, Ser: 0.95
Creatinine, Ser: 1.3
Creatinine, Ser: 2.17 — ABNORMAL HIGH
GFR calc Af Amer: 29 — ABNORMAL LOW
GFR calc Af Amer: >60
GFR calc Af Amer: >60
GFR calc Af Amer: >60
GFR calc non Af Amer: 55 — ABNORMAL LOW
GFR calc non Af Amer: >60
Glucose, Bld: 108 — ABNORMAL HIGH
Glucose, Bld: 91
Potassium: 3.6
Sodium: 136
Sodium: 137
Sodium: 141
Sodium: 142
Total Bilirubin: 0.2 — ABNORMAL LOW
Total Bilirubin: 0.3
Total Bilirubin: 0.4
Total Protein: 6.9

## 2011-01-11 LAB — URINALYSIS, ROUTINE W REFLEX MICROSCOPIC
Bilirubin Urine: NEGATIVE
Glucose, UA: NEGATIVE
Hgb urine dipstick: NEGATIVE
Ketones, ur: NEGATIVE
Protein, ur: NEGATIVE
Urobilinogen, UA: 0.2

## 2011-01-11 LAB — POCT I-STAT 3, ART BLOOD GAS (G3+)
Acid-Base Excess: 1
Acid-Base Excess: 2
Acid-base deficit: 1
O2 Saturation: 98
O2 Saturation: 99
Operator id: 251151
Operator id: 270211
Patient temperature: 97.7
pCO2 arterial: 45.3 — ABNORMAL HIGH
pO2, Arterial: 108 — ABNORMAL HIGH
pO2, Arterial: 85

## 2011-01-11 LAB — PROTIME-INR
INR: 0.9
INR: 2 — ABNORMAL HIGH
Prothrombin Time: 12.8
Prothrombin Time: 22.4 — ABNORMAL HIGH
Prothrombin Time: 26 — ABNORMAL HIGH
Prothrombin Time: 27.4 — ABNORMAL HIGH

## 2011-01-11 LAB — DIFFERENTIAL
Basophils Absolute: 0
Basophils Relative: 0
Eosinophils Absolute: 0
Neutro Abs: 9.9 — ABNORMAL HIGH
Neutrophils Relative %: 84 — ABNORMAL HIGH

## 2011-01-11 LAB — BASIC METABOLIC PANEL
BUN: 16
CO2: 27
Calcium: 9
Chloride: 104
Creatinine, Ser: 0.93
GFR calc Af Amer: >60
Glucose, Bld: 141 — ABNORMAL HIGH

## 2011-01-11 LAB — CULTURE, RESPIRATORY W GRAM STAIN: Culture: NORMAL

## 2011-01-11 LAB — IRON AND TIBC
Iron: 13 — ABNORMAL LOW
Saturation Ratios: 4 — ABNORMAL LOW
TIBC: 353
UIBC: 340

## 2011-01-11 LAB — POCT I-STAT, CHEM 8
Calcium, Ion: 1.11 — ABNORMAL LOW
Glucose, Bld: 121 — ABNORMAL HIGH
HCT: 35 — ABNORMAL LOW
Hemoglobin: 11.9 — ABNORMAL LOW
TCO2: 26

## 2011-01-11 LAB — B-NATRIURETIC PEPTIDE (CONVERTED LAB): Pro B Natriuretic peptide (BNP): 57

## 2011-01-11 LAB — HEPARIN LEVEL (UNFRACTIONATED)
Heparin Unfractionated: 0.16 — ABNORMAL LOW
Heparin Unfractionated: 0.26 — ABNORMAL LOW
Heparin Unfractionated: 0.91 — ABNORMAL HIGH

## 2011-01-11 LAB — CARDIAC PANEL(CRET KIN+CKTOT+MB+TROPI)
Relative Index: 0.6
Total CK: 783 — ABNORMAL HIGH
Troponin I: 0.53

## 2011-01-11 LAB — RETICULOCYTES: Retic Ct Pct: 2.2

## 2011-01-11 LAB — LEGIONELLA ANTIGEN, URINE

## 2011-01-11 LAB — RAPID STREP SCREEN (MED CTR MEBANE ONLY): Streptococcus, Group A Screen (Direct): NEGATIVE

## 2011-01-11 LAB — D-DIMER, QUANTITATIVE: D-Dimer, Quant: 5.11 — ABNORMAL HIGH

## 2011-01-11 LAB — VITAMIN B12: Vitamin B-12: 985 — ABNORMAL HIGH (ref 211–911)

## 2011-01-11 LAB — MONONUCLEOSIS SCREEN: Mono Screen: NEGATIVE

## 2011-01-11 LAB — HOMOCYSTEINE: Homocysteine: 9.9

## 2011-01-11 LAB — OCCULT BLOOD X 1 CARD TO LAB, STOOL: Fecal Occult Bld: NEGATIVE

## 2011-01-11 LAB — POCT CARDIAC MARKERS: CKMB, poc: 3.2

## 2011-09-14 IMAGING — CR DG HIP (WITH OR WITHOUT PELVIS) 2-3V*L*
3 series · 3 of 3 positions shown · non-contrast
Comparison: Left knee radiographs 05/24/2010

CLINICAL DATA: Left leg pain and left knee pain for 1 week.  No
known injury.

LEFT HIP - COMPLETE 2+ VIEW

[view not recorded (1 of 3)]
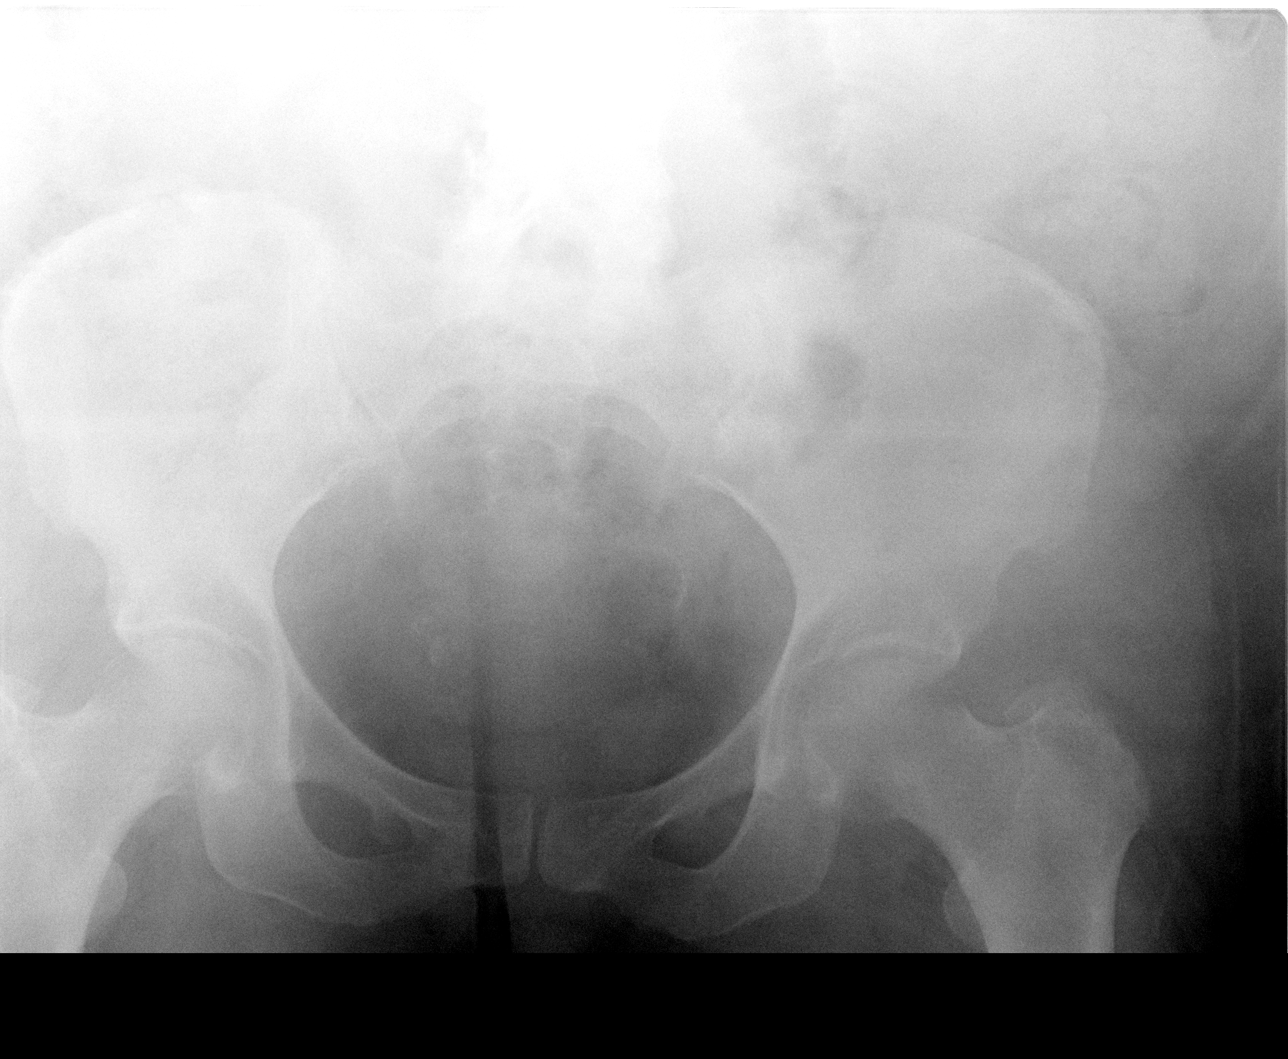

[view not recorded (2 of 3)]
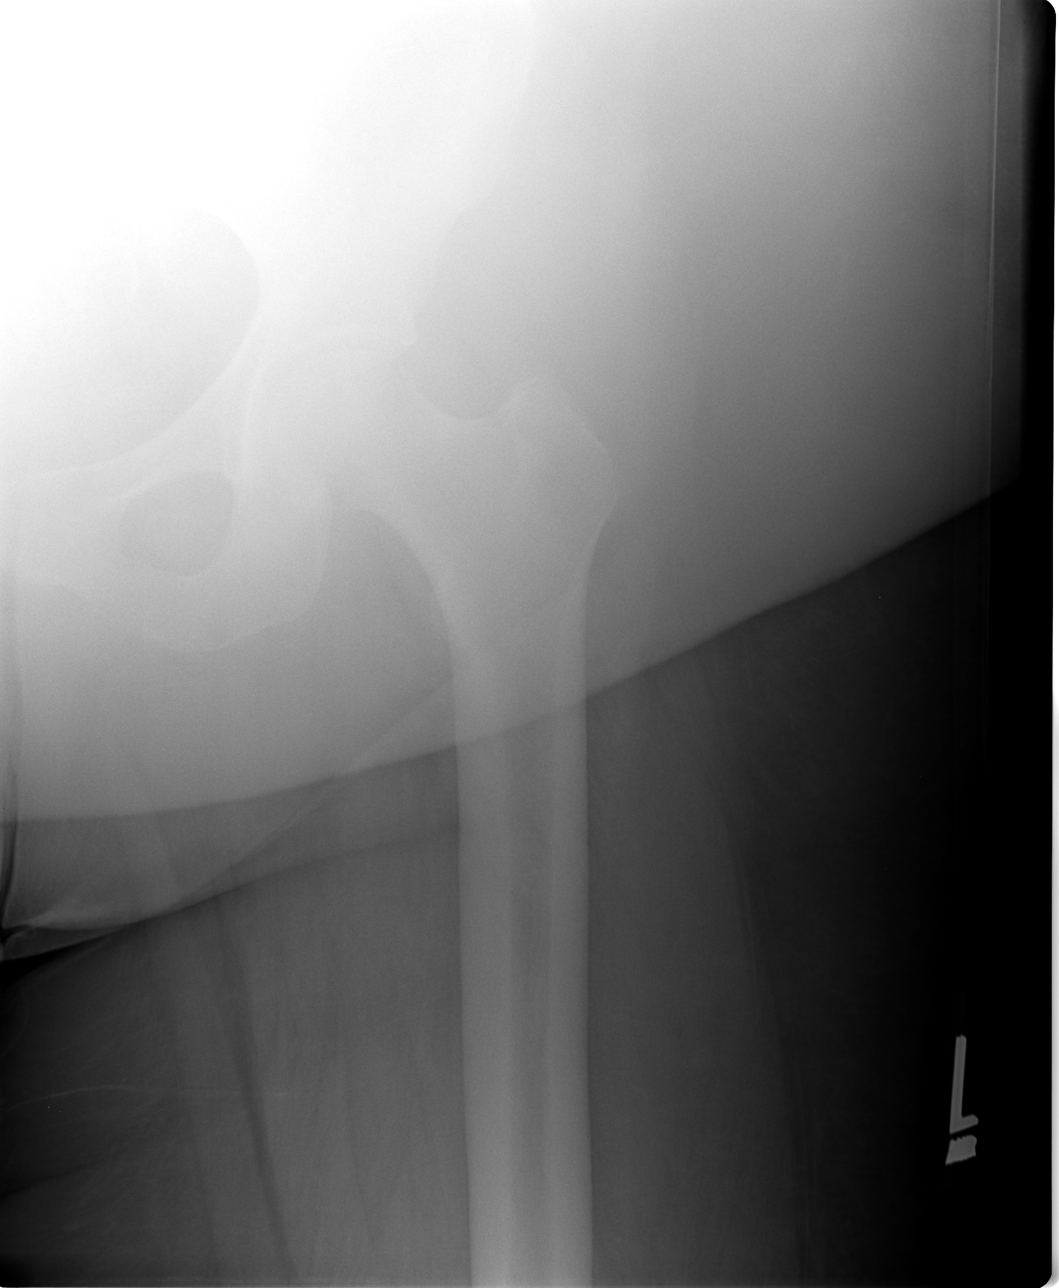

[view not recorded (3 of 3)]
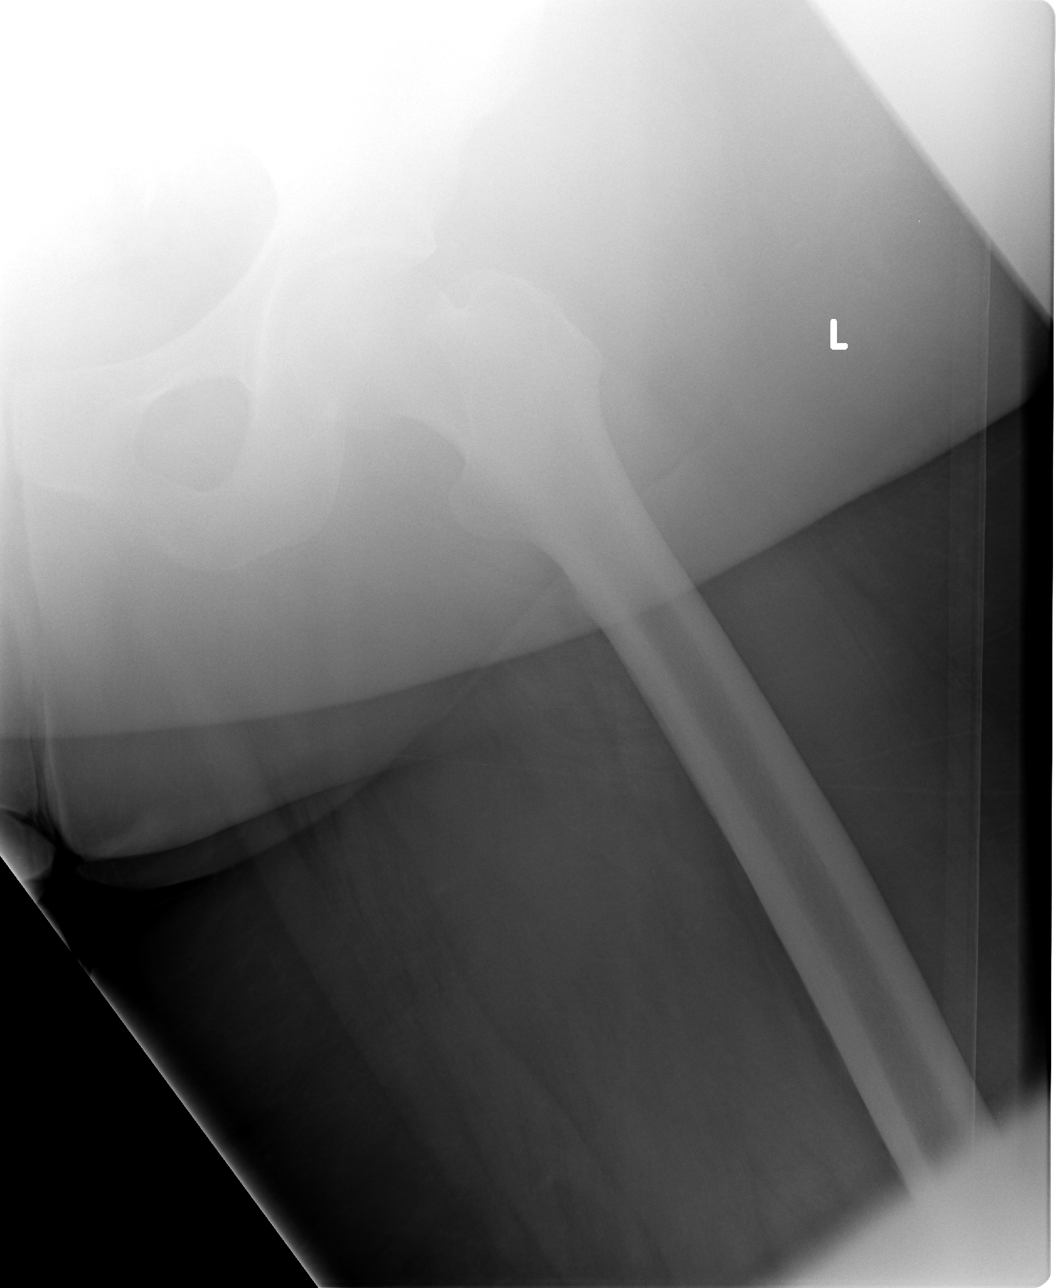

[3 of 3 positions shown; findings below may reference images not displayed]

FINDINGS: Obesity limits bony detail.  Left femoral head is located
within the left acetabulum.  Bony mineralization appears normal.
Joint space of the hip appears maintained.  No significant
degenerative change for patient age.  No acute fracture of the bony
pelvis or proximal left femur is identified.  Suggestion of
degenerative change of the lower lumbar spine.
IMPRESSION: Decreased bony detail due to body habitus.

No acute bony abnormality and no significant degenerative change of
the left hip.

## 2011-09-14 IMAGING — CR DG KNEE 1-2V*L*
2 series · 2 of 2 positions shown · non-contrast
Comparison: Left hip radiographs 05/24/2002

CLINICAL DATA: Left knee pain for 1 week.  No known injury.

LEFT KNEE - 1-2 VIEW

[view not recorded (1 of 2)]
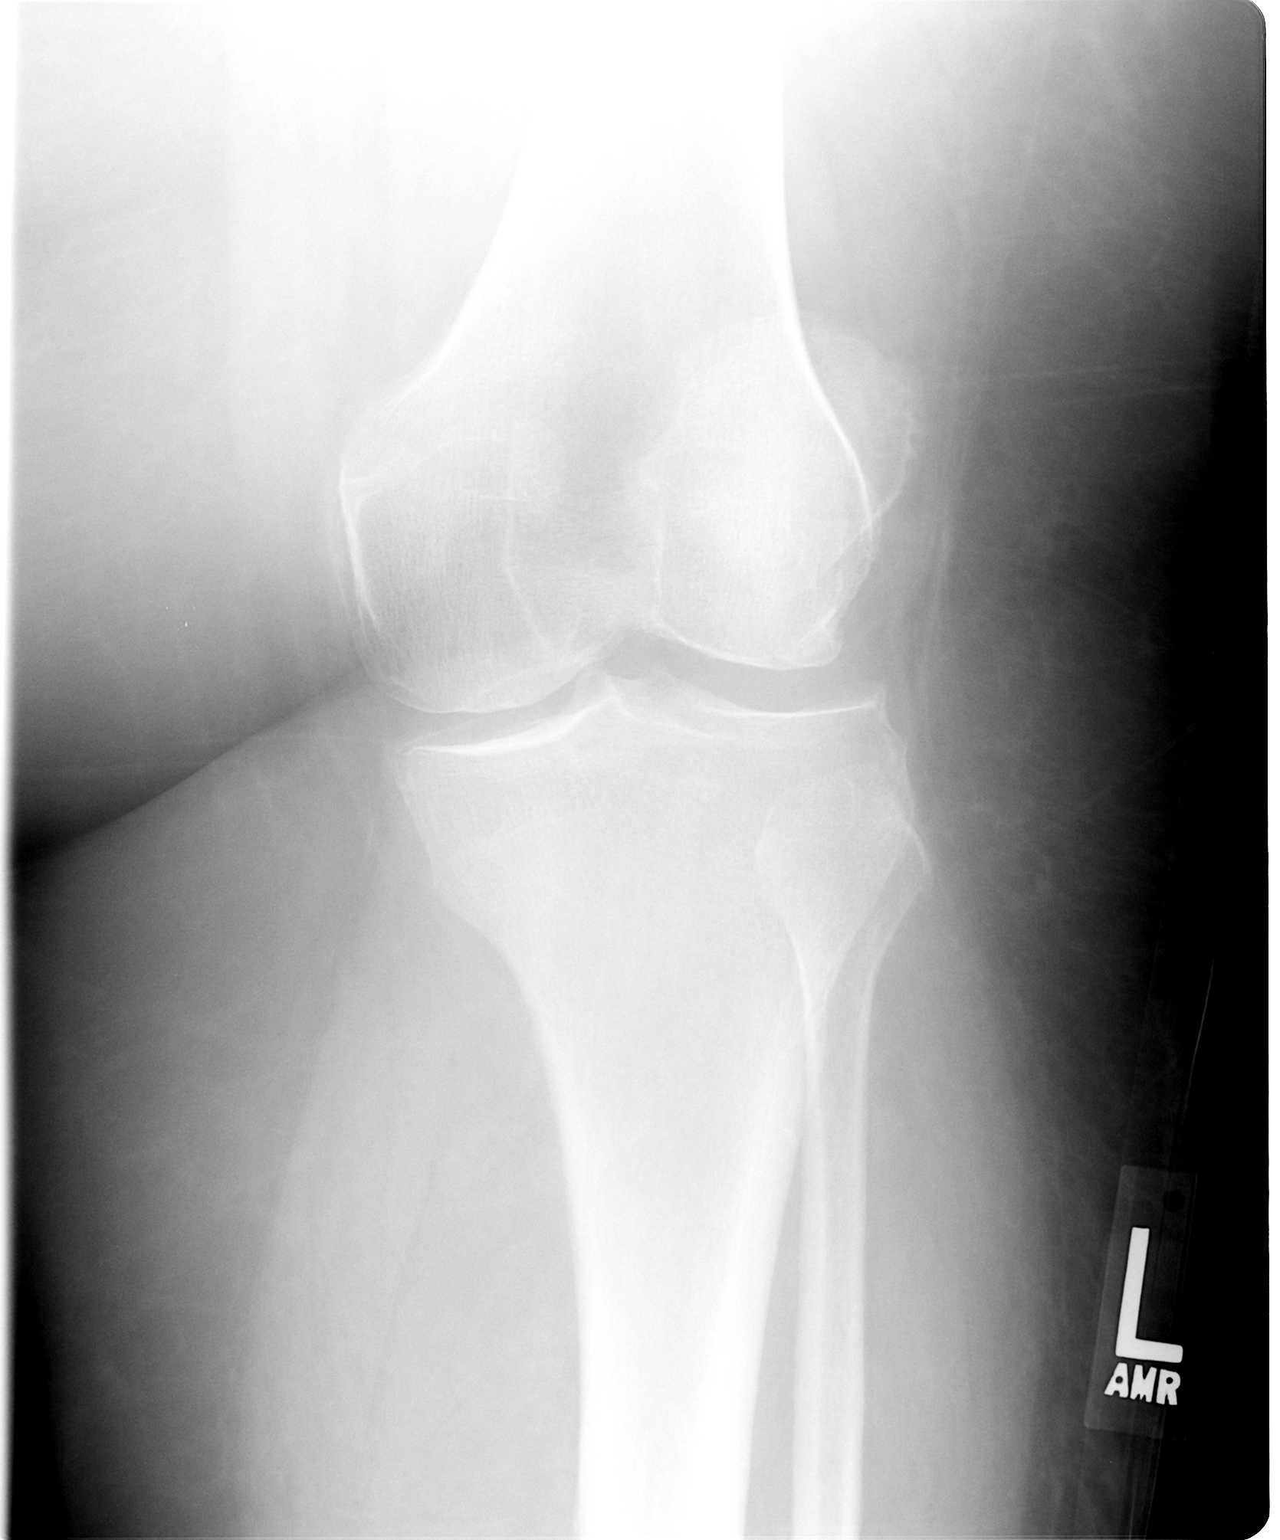

[view not recorded (2 of 2)]
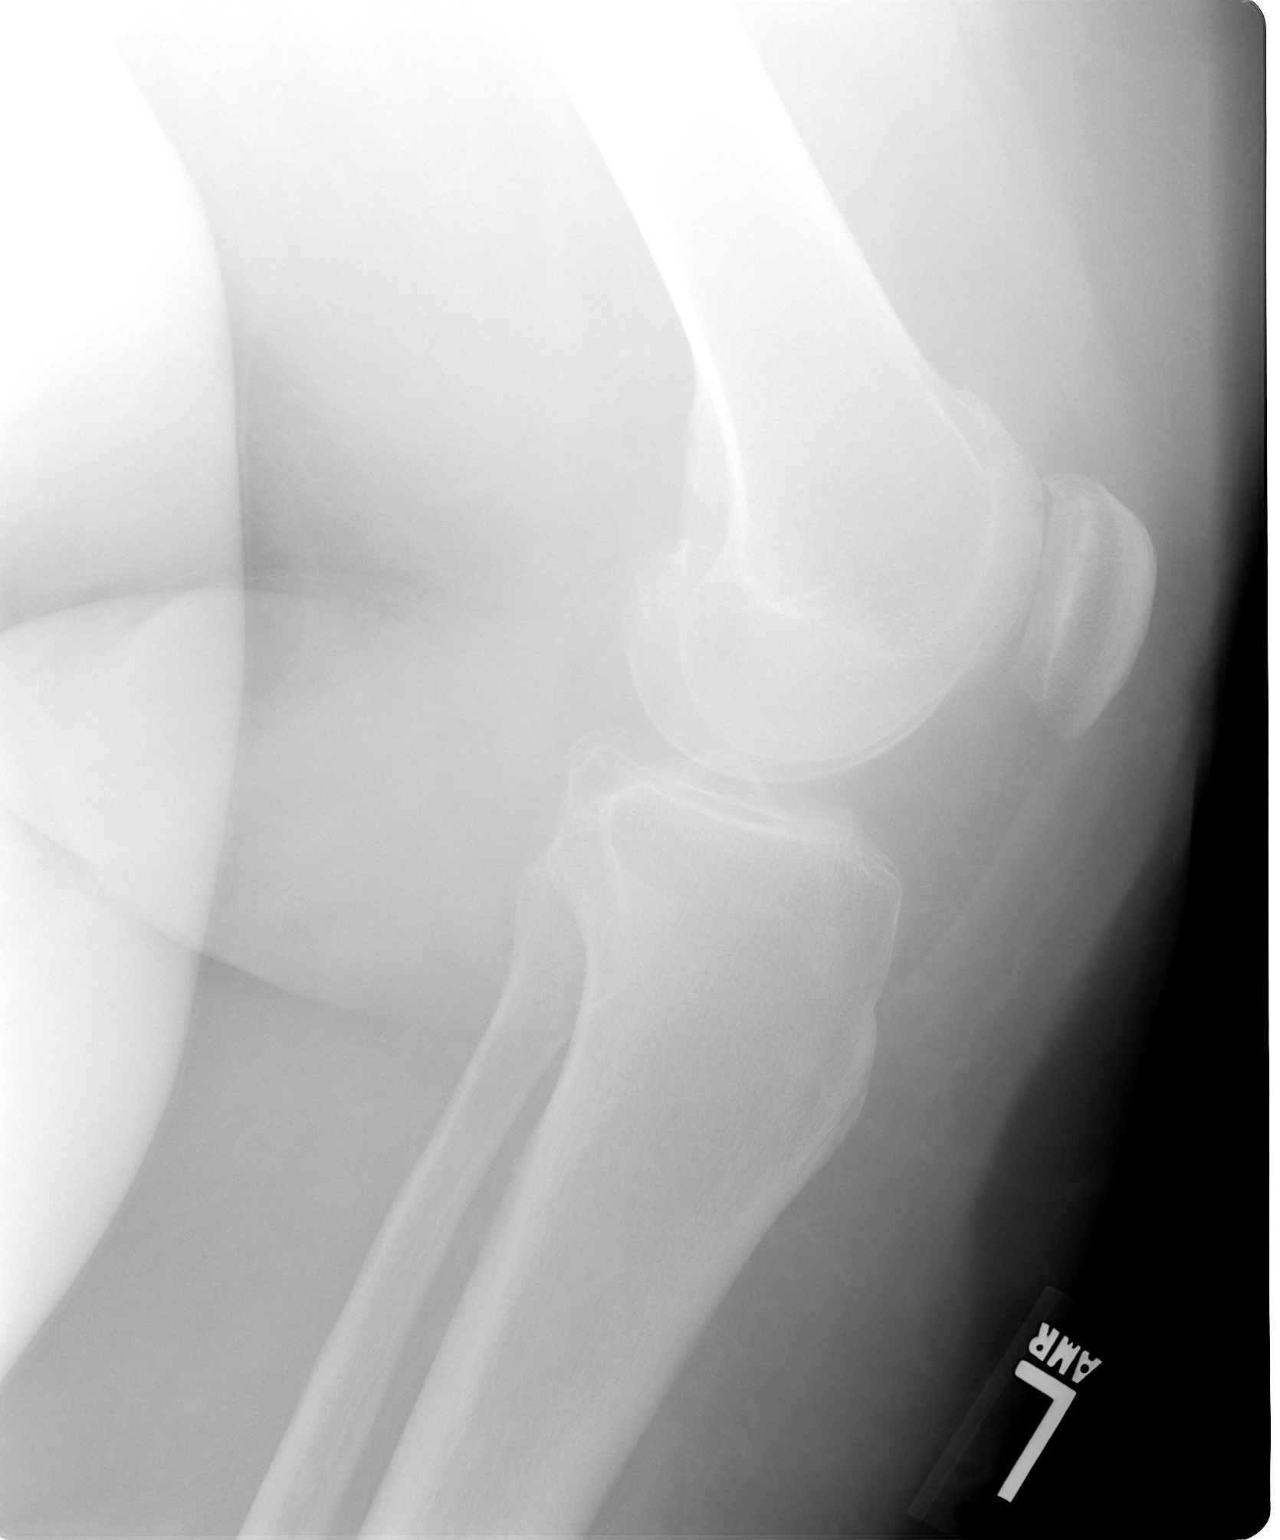

[2 of 2 positions shown; findings below may reference images not displayed]

FINDINGS: Obesity slightly limits bony detail Normal alignment and
mineralization.  Small osteophyte arises from the posterior-
superior patella.  Small osteophyte  is associated with the lateral
tibial plateau.  No fracture or visible joint effusion.  No
suspicious bony abnormality.
IMPRESSION: 1.  Mild degenerative changes of the patellofemoral and lateral
compartments.
2.  No acute bony abnormality or joint effusion.

## 2014-04-27 ENCOUNTER — Encounter (HOSPITAL_COMMUNITY): Payer: Self-pay | Admitting: Emergency Medicine

## 2014-04-27 ENCOUNTER — Emergency Department (HOSPITAL_COMMUNITY)
Admission: EM | Admit: 2014-04-27 | Discharge: 2014-04-27 | Disposition: A | Discharge status: Home / Self Care | Payer: Self-pay | Attending: Emergency Medicine | Admitting: Emergency Medicine

## 2014-04-27 DIAGNOSIS — Z87442 Personal history of urinary calculi: Secondary | ICD-10-CM | POA: Insufficient documentation

## 2014-04-27 DIAGNOSIS — Z86711 Personal history of pulmonary embolism: Secondary | ICD-10-CM | POA: Insufficient documentation

## 2014-04-27 DIAGNOSIS — Z87891 Personal history of nicotine dependence: Secondary | ICD-10-CM | POA: Insufficient documentation

## 2014-04-27 DIAGNOSIS — Z86718 Personal history of other venous thrombosis and embolism: Secondary | ICD-10-CM | POA: Insufficient documentation

## 2014-04-27 DIAGNOSIS — J449 Chronic obstructive pulmonary disease, unspecified: Secondary | ICD-10-CM | POA: Insufficient documentation

## 2014-04-27 DIAGNOSIS — Z8739 Personal history of other diseases of the musculoskeletal system and connective tissue: Secondary | ICD-10-CM | POA: Insufficient documentation

## 2014-04-27 DIAGNOSIS — Z880 Allergy status to penicillin: Secondary | ICD-10-CM | POA: Insufficient documentation

## 2014-04-27 DIAGNOSIS — I10 Essential (primary) hypertension: Secondary | ICD-10-CM | POA: Insufficient documentation

## 2014-04-27 DIAGNOSIS — K529 Noninfective gastroenteritis and colitis, unspecified: Secondary | ICD-10-CM | POA: Insufficient documentation

## 2014-04-27 HISTORY — DX: Calculus of kidney: N20.0

## 2014-04-27 HISTORY — DX: Edema, unspecified: R60.9

## 2014-04-27 HISTORY — DX: Other pulmonary embolism without acute cor pulmonale: I26.99

## 2014-04-27 HISTORY — DX: Essential (primary) hypertension: I10

## 2014-04-27 HISTORY — DX: Gout, unspecified: M10.9

## 2014-04-27 HISTORY — DX: Acute embolism and thrombosis of unspecified deep veins of unspecified lower extremity: I82.409

## 2014-04-27 HISTORY — DX: Obesity, unspecified: E66.9

## 2014-04-27 HISTORY — DX: Chronic obstructive pulmonary disease, unspecified: J44.9

## 2014-04-27 LAB — CBC WITH DIFFERENTIAL/PLATELET
BASOS ABS: 0 10*3/uL (ref 0.0–0.1)
BASOS PCT: 0 % (ref 0–1)
EOS ABS: 0.1 10*3/uL (ref 0.0–0.7)
EOS PCT: 1 % (ref 0–5)
HCT: 34.6 % — ABNORMAL LOW (ref 36.0–46.0)
HEMOGLOBIN: 8.9 g/dL — AB (ref 12.0–15.0)
LYMPHS PCT: 12 % (ref 12–46)
Lymphs Abs: 1.3 10*3/uL (ref 0.7–4.0)
MCH: 18 pg — ABNORMAL LOW (ref 26.0–34.0)
MCHC: 25.7 g/dL — ABNORMAL LOW (ref 30.0–36.0)
MCV: 69.9 fL — AB (ref 78.0–100.0)
MONO ABS: 0.7 10*3/uL (ref 0.1–1.0)
Monocytes Relative: 6 % (ref 3–12)
Neutro Abs: 9.4 10*3/uL — ABNORMAL HIGH (ref 1.7–7.7)
Neutrophils Relative %: 82 % — ABNORMAL HIGH (ref 43–77)
Platelets: 382 10*3/uL (ref 150–400)
RBC: 4.95 MIL/uL (ref 3.87–5.11)
RDW: 20.2 % — ABNORMAL HIGH (ref 11.5–15.5)
WBC: 11.5 10*3/uL — ABNORMAL HIGH (ref 4.0–10.5)

## 2014-04-27 LAB — COMPREHENSIVE METABOLIC PANEL
ALBUMIN: 3.8 g/dL (ref 3.5–5.2)
ALK PHOS: 132 U/L — AB (ref 39–117)
ALT: 106 U/L — AB (ref 0–35)
AST: 32 U/L (ref 0–37)
Anion gap: 6 (ref 5–15)
BUN: 15 mg/dL (ref 6–23)
CALCIUM: 8.5 mg/dL (ref 8.4–10.5)
CHLORIDE: 93 meq/L — AB (ref 96–112)
CO2: 36 mmol/L — ABNORMAL HIGH (ref 19–32)
Creatinine, Ser: 1.11 mg/dL — ABNORMAL HIGH (ref 0.50–1.10)
GFR, EST AFRICAN AMERICAN: 62 mL/min — AB (ref >90)
GFR, EST NON AFRICAN AMERICAN: 53 mL/min — AB (ref >90)
Glucose, Bld: 158 mg/dL — ABNORMAL HIGH (ref 70–99)
Potassium: 4.3 mmol/L (ref 3.5–5.1)
Sodium: 135 mmol/L (ref 135–145)
TOTAL PROTEIN: 8.5 g/dL — AB (ref 6.0–8.3)
Total Bilirubin: 0.5 mg/dL (ref 0.3–1.2)

## 2014-04-27 LAB — LIPASE, BLOOD: Lipase: 18 U/L (ref 11–59)

## 2014-04-27 MED ORDER — PROMETHAZINE HCL 25 MG PO TABS: 25.0000 mg | ORAL_TABLET | Freq: Four times a day (QID) | ORAL | Status: AC | PRN

## 2014-04-27 MED ORDER — PROMETHAZINE HCL 25 MG/ML IJ SOLN
12.5000 mg | Freq: Once | INTRAMUSCULAR | Status: AC
  Administered 2014-04-27: 12.5 mg via INTRAVENOUS
  Filled 2014-04-27: qty 1

## 2014-04-27 MED ORDER — MORPHINE SULFATE 4 MG/ML IJ SOLN
4.0000 mg | Freq: Once | INTRAMUSCULAR | Status: AC
  Administered 2014-04-27: 4 mg via INTRAVENOUS
  Filled 2014-04-27: qty 1

## 2014-04-27 MED ORDER — SODIUM CHLORIDE 0.9 % IV BOLUS (SEPSIS)
500.0000 mL | Freq: Once | INTRAVENOUS | Status: AC
  Administered 2014-04-27: 500 mL via INTRAVENOUS

## 2014-04-27 MED ORDER — SODIUM CHLORIDE 0.9 % IV BOLUS (SEPSIS)
1000.0000 mL | Freq: Once | INTRAVENOUS | Status: AC
  Administered 2014-04-27: 1000 mL via INTRAVENOUS

## 2014-04-27 MED ORDER — ONDANSETRON HCL 4 MG/2ML IJ SOLN
4.0000 mg | Freq: Once | INTRAMUSCULAR | Status: AC
  Administered 2014-04-27: 4 mg via INTRAVENOUS
  Filled 2014-04-27: qty 2

## 2014-04-27 MED ORDER — PANTOPRAZOLE SODIUM 40 MG IV SOLR
40.0000 mg | Freq: Once | INTRAVENOUS | Status: AC
  Administered 2014-04-27: 40 mg via INTRAVENOUS
  Filled 2014-04-27: qty 40

## 2014-04-27 MED ORDER — FAMOTIDINE 20 MG PO TABS: 20.0000 mg | ORAL_TABLET | Freq: Two times a day (BID) | ORAL | Status: AC

## 2014-04-27 NOTE — Discharge Instructions (Signed)
Medicine for nausea and for stomach discomfort. Increase fluids. Follow-up your primary care doctor

## 2014-04-27 NOTE — ED Notes (Signed)
Changed out O2 tank for full tank

## 2014-04-27 NOTE — ED Notes (Signed)
Patient c/o nausea and vomiting that started Wednesday after "eating [place] pot pie." Patient taken phenergan with no relief. Denies any abd pain, diarrhea, fevers, or urinary symptoms. Patient reports feeling fatigue.

## 2014-04-27 NOTE — ED Provider Notes (Signed)
CSN: [id number]     Arrival date & time 04/27/14  1304 History   First MD Initiated Contact with Patient 04/27/14 1532     Chief Complaint  Patient presents with  . Emesis     (Consider location/radiation/quality/duration/timing/severity/associated sxs/prior Treatment) HPI.... "Dry heaves"  since Monday after eating a [place] pie.  No fever, chills, diarrhea, chest pain, dyspnea. She is urinating. No abdominal surgery. Severity is moderate. Nothing makes symptoms better or worse.  Past Medical History  Diagnosis Date  . Edema   . Hypertension   . DVT (deep venous thrombosis)   . PE (pulmonary embolism)   . COPD (chronic obstructive pulmonary disease)   . Obese   . Kidney stones   . Gout    History reviewed. No pertinent past surgical history. Family History  Problem Relation Age of Onset  . Stroke Brother   . Heart attack Mother   . Suicidality Father    History  Substance Use Topics  . Smoking status: Former Smoker -- 0.50 packs/day for 10 years    Types: Cigarettes    Quit date: 04/18/1993  . Smokeless tobacco: Never Used  . Alcohol Use: No   OB History    Gravida Para Term Preterm AB TAB SAB Ectopic Multiple Living   Review of Systems  All other systems reviewed and are negative.     Allergies  Penicillins  Home Medications   Prior to Admission medications   Medication Sig Start Date End Date Taking? Authorizing Provider  allopurinol (ZYLOPRIM) 300 MG tablet Take 300 mg by mouth daily.   Yes Historical Provider, MD  benazepril (LOTENSIN) 20 MG tablet Take 20 mg by mouth daily.   Yes Historical Provider, MD  bumetanide (BUMEX) 0.5 MG tablet Take 0.5 mg by mouth daily as needed (FOR FLUID RETENTION).   Yes Historical Provider, MD  oxycodone (ROXICODONE) 30 MG immediate release tablet Take 30 mg by mouth 3 (three) times daily.   Yes Historical Provider, MD  oxymetazoline (NASAL SPRAY 12 HOUR) 0.05 % nasal spray Place 1 spray into both  nostrils 2 (two) times daily as needed for congestion.   Yes Historical Provider, MD  famotidine (PEPCID) 20 MG tablet Take 1 tablet (20 mg total) by mouth 2 (two) times daily. 04/27/14   Donnetta Hutching, MD  promethazine (PHENERGAN) 25 MG tablet Take 1 tablet (25 mg total) by mouth every 6 (six) hours as needed. 04/27/14   Donnetta Hutching, MD   BP 153/60 mmHg  Pulse 84  Temp(Src) 98.5 F (36.9 C) (Oral)  Resp 28  Ht  (1.626 m)  Wt 397 lb 8 oz (180.305 kg)  BMI 68.20 kg/m2  SpO2 98%  LMP 04/27/2014 Physical Exam  Constitutional: She is oriented to person, place, and time.  Morbidly obese  HENT:  Head: Normocephalic and atraumatic.  Eyes: Conjunctivae and EOM are normal. Pupils are equal, round, and reactive to light.  Neck: Normal range of motion. Neck supple.  Cardiovascular: Normal rate and regular rhythm.   Pulmonary/Chest: Effort normal and breath sounds normal.  Abdominal: Soft. Bowel sounds are normal.  No acute abdomen  Musculoskeletal: Normal range of motion.  Neurological: She is alert and oriented to person, place, and time.  Skin: Skin is warm and dry.  Psychiatric: She has a normal mood and affect. Her behavior is normal.  Nursing note and vitals reviewed.   ED Course  Procedures (  including critical care time) Labs Review Labs Reviewed  CBC WITH DIFFERENTIAL - Abnormal; Notable for the following:    WBC 11.5 (*)    Hemoglobin 8.9 (*)    HCT 34.6 (*)    MCV 69.9 (*)    MCH 18.0 (*)    MCHC 25.7 (*)    RDW 20.2 (*)    Neutrophils Relative % 82 (*)    Neutro Abs 9.4 (*)    All other components within normal limits  COMPREHENSIVE METABOLIC PANEL - Abnormal; Notable for the following:    Chloride 93 (*)    CO2 36 (*)    Glucose, Bld 158 (*)    Creatinine, Ser 1.11 (*)    Total Protein 8.5 (*)    ALT 106 (*)    Alkaline Phosphatase 132 (*)    GFR calc non Af Amer 53 (*)    GFR calc Af Amer 62 (*)    All other components within normal limits  LIPASE, BLOOD     Imaging Review No results found.   EKG Interpretation None      MDM   Final diagnoses:  Gastroenteritis    No acute abdomen. Patient feels better after 2 L of IV fluids, IV Zofran, IV morphine.  Discharge medications Phenergan 25 mg and Pepcid 20 mg.  Normal vital signs   Donnetta HutchingBrian Maree Ainley, MD 04/27/14 2136
